# Patient Record
Sex: Male | Born: 1985 | Race: White | Hispanic: No | Marital: Single | Smoking: Current every day smoker
Health system: Southern US, Community
[De-identification: ages and names within clinical notes are randomized; demographics above are authoritative.]

---

## 2012-02-26 ENCOUNTER — Emergency Department (HOSPITAL_COMMUNITY): Payer: Self-pay

## 2012-02-26 ENCOUNTER — Encounter (HOSPITAL_COMMUNITY): Payer: Self-pay | Admitting: *Deleted

## 2012-02-26 ENCOUNTER — Emergency Department (HOSPITAL_COMMUNITY)
Admission: EM | Admit: 2012-02-26 | Discharge: 2012-02-26 | Disposition: A | Discharge status: Home / Self Care | Payer: Self-pay | Attending: Emergency Medicine | Admitting: Emergency Medicine

## 2012-02-26 DIAGNOSIS — S0180XA Unspecified open wound of other part of head, initial encounter: Secondary | ICD-10-CM | POA: Insufficient documentation

## 2012-02-26 DIAGNOSIS — S0085XA Superficial foreign body of other part of head, initial encounter: Secondary | ICD-10-CM | POA: Insufficient documentation

## 2012-02-26 DIAGNOSIS — F17200 Nicotine dependence, unspecified, uncomplicated: Secondary | ICD-10-CM | POA: Insufficient documentation

## 2012-02-26 DIAGNOSIS — IMO0002 Reserved for concepts with insufficient information to code with codable children: Secondary | ICD-10-CM | POA: Insufficient documentation

## 2012-02-26 DIAGNOSIS — S01112A Laceration without foreign body of left eyelid and periocular area, initial encounter: Secondary | ICD-10-CM

## 2012-02-26 DIAGNOSIS — F10929 Alcohol use, unspecified with intoxication, unspecified: Secondary | ICD-10-CM

## 2012-02-26 DIAGNOSIS — R4182 Altered mental status, unspecified: Secondary | ICD-10-CM | POA: Insufficient documentation

## 2012-02-26 DIAGNOSIS — T07XXXA Unspecified multiple injuries, initial encounter: Secondary | ICD-10-CM

## 2012-02-26 DIAGNOSIS — S0005XA Superficial foreign body of scalp, initial encounter: Secondary | ICD-10-CM | POA: Insufficient documentation

## 2012-02-26 DIAGNOSIS — R0602 Shortness of breath: Secondary | ICD-10-CM | POA: Insufficient documentation

## 2012-02-26 LAB — URINALYSIS, MICROSCOPIC ONLY
Ketones, ur: NEGATIVE mg/dL
Leukocytes, UA: NEGATIVE
Nitrite: NEGATIVE
Protein, ur: NEGATIVE mg/dL

## 2012-02-26 LAB — POCT I-STAT, CHEM 8
Creatinine, Ser: 1 mg/dL (ref 0.50–1.35)
Hemoglobin: 17.7 g/dL — ABNORMAL HIGH (ref 13.0–17.0)
Sodium: 143 mEq/L (ref 135–145)
TCO2: 23 mmol/L (ref 0–100)

## 2012-02-26 LAB — COMPREHENSIVE METABOLIC PANEL
ALT: 21 U/L (ref 0–53)
AST: 26 U/L (ref 0–37)
CO2: 24 mEq/L (ref 19–32)
Chloride: 104 mEq/L (ref 96–112)
Creatinine, Ser: 0.64 mg/dL (ref 0.50–1.35)
GFR calc non Af Amer: >90 mL/min (ref >90)
Total Bilirubin: 0.5 mg/dL (ref 0.3–1.2)

## 2012-02-26 LAB — CBC
MCH: 32.9 pg (ref 26.0–34.0)
MCHC: 35.5 g/dL (ref 30.0–36.0)
Platelets: 152 10*3/uL (ref 150–400)
RBC: 5.32 MIL/uL (ref 4.22–5.81)

## 2012-02-26 LAB — ETHANOL: Alcohol, Ethyl (B): 228 mg/dL — ABNORMAL HIGH (ref 0–11)

## 2012-02-26 LAB — SAMPLE TO BLOOD BANK

## 2012-02-26 LAB — PROTIME-INR: INR: 0.91 (ref 0.00–1.49)

## 2012-02-26 MED ORDER — HYDROCODONE-ACETAMINOPHEN 5-500 MG PO TABS: 1.0000 | ORAL_TABLET | Freq: Four times a day (QID) | ORAL | Status: AC | PRN

## 2012-02-26 MED ORDER — IOHEXOL 300 MG/ML  SOLN
100.0000 mL | Freq: Once | INTRAMUSCULAR | Status: AC | PRN
  Administered 2012-02-26: 100 mL via INTRAVENOUS
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
## 2012-02-26 NOTE — ED Notes (Addendum)
Pt. Wound care with normal saline and (2) 4 x 4. Pt. laceration bleeding and painful.
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
## 2012-02-26 NOTE — ED Notes (Signed)
LSB removed by this RN, Dr. Norlene Campbell, Juliette Mangle RN, [place] NT
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
## 2015-10-01 ENCOUNTER — Emergency Department (HOSPITAL_COMMUNITY): Payer: No Typology Code available for payment source

## 2015-10-01 ENCOUNTER — Encounter (HOSPITAL_COMMUNITY): Payer: Self-pay

## 2015-10-01 ENCOUNTER — Emergency Department (HOSPITAL_COMMUNITY)
Admission: EM | Admit: 2015-10-01 | Discharge: 2015-10-01 | Disposition: A | Discharge status: Home / Self Care | Payer: No Typology Code available for payment source | Attending: Emergency Medicine | Admitting: Emergency Medicine

## 2015-10-01 DIAGNOSIS — Y990 Civilian activity done for income or pay: Secondary | ICD-10-CM | POA: Diagnosis not present

## 2015-10-01 DIAGNOSIS — Y9301 Activity, walking, marching and hiking: Secondary | ICD-10-CM | POA: Diagnosis not present

## 2015-10-01 DIAGNOSIS — S80811A Abrasion, right lower leg, initial encounter: Secondary | ICD-10-CM | POA: Diagnosis not present

## 2015-10-01 DIAGNOSIS — Y92410 Unspecified street and highway as the place of occurrence of the external cause: Secondary | ICD-10-CM | POA: Insufficient documentation

## 2015-10-01 DIAGNOSIS — S8992XA Unspecified injury of left lower leg, initial encounter: Secondary | ICD-10-CM | POA: Diagnosis present

## 2015-10-01 DIAGNOSIS — M25562 Pain in left knee: Secondary | ICD-10-CM

## 2015-10-01 DIAGNOSIS — F17210 Nicotine dependence, cigarettes, uncomplicated: Secondary | ICD-10-CM | POA: Insufficient documentation

## 2015-10-01 MED ORDER — IBUPROFEN 400 MG PO TABS
400.0000 mg | ORAL_TABLET | Freq: Once | ORAL | Status: AC
  Administered 2015-10-01: 400 mg via ORAL
  Filled 2015-10-01: qty 1
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
## 2015-10-01 NOTE — Discharge Instructions (Signed)
There does not appear to be an emergent cause for your knee pain at this time. Your exam is reassuring. Your x-ray showed no broken bones or dislocations. You may use your knee sleeve as we discussed. Also use Motrin and Tylenol for discomfort. Follow up with orthopedic surgery for reevaluation if her symptoms do not improve. Return to ED for new or worsening symptoms as we discussed.  Joint Pain Joint pain, which is also called arthralgia, can be caused by many things. Joint pain often goes away when you follow your health care provider's instructions for relieving pain at home. However, joint pain can also be caused by conditions that require further treatment. Common causes of joint pain include:  Bruising in the area of the joint.  Overuse of the joint.  Wear and tear on the joints that occur with aging (osteoarthritis).  Various other forms of arthritis.  A buildup of a crystal form of uric acid in the joint (gout).  Infections of the joint (septic arthritis) or of the bone (osteomyelitis). Your health care provider may recommend medicine to help with the pain. If your joint pain continues, additional tests may be needed to diagnose your condition. HOME CARE INSTRUCTIONS Watch your condition for any changes. Follow these instructions as directed to lessen the pain that you are feeling.  Take medicines only as directed by your health care provider.  Rest the affected area for as long as your health care provider says that you should. If directed to do so, raise the painful joint above the level of your heart while you are sitting or lying down.  Do not do things that cause or worsen pain.  If directed, apply ice to the painful area:  Put ice in a plastic bag.  Place a towel between your skin and the bag.  Leave the ice on for 20 minutes, 2-3 times per day.  Wear an elastic bandage, splint, or sling as directed by your health care provider. Loosen the elastic bandage or splint if  your fingers or toes become numb and tingle, or if they turn cold and blue.  Begin exercising or stretching the affected area as directed by your health care provider. Ask your health care provider what types of exercise are safe for you.  Keep all follow-up visits as directed by your health care provider. This is important. SEEK MEDICAL CARE IF:  Your pain increases, and medicine does not help.  Your joint pain does not improve within 3 days.  You have increased bruising or swelling.  You have a fever.  You lose 10 lb (4.5 kg) or more without trying. SEEK IMMEDIATE MEDICAL CARE IF:  You are not able to move the joint.  Your fingers or toes become numb or they turn cold and blue.   This information is not intended to replace advice given to you by your health care provider. Make sure you discuss any questions you have with your health care provider.   Document Released: 06/25/2005 Document Revised: 07/16/2014 Document Reviewed: 04/06/2014 Elsevier Interactive Patient Education [employer]2016 Elsevier Inc.

## 2015-10-01 NOTE — ED Notes (Signed)
Declined W/C at D/C and was escorted to lobby by RN. 
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
## 2015-10-05 DIAGNOSIS — S8992XA Unspecified injury of left lower leg, initial encounter: Secondary | ICD-10-CM | POA: Insufficient documentation

## 2018-08-14 ENCOUNTER — Encounter: Payer: Self-pay | Admitting: Physician Assistant

## 2018-08-14 ENCOUNTER — Other Ambulatory Visit: Payer: Self-pay

## 2018-08-14 ENCOUNTER — Ambulatory Visit: Payer: Self-pay | Admitting: Physician Assistant

## 2018-08-14 VITALS — BP 113/73 | HR 62 | Temp 98.0°F | Resp 16 | Ht 72.0 in | Wt 163.0 lb

## 2018-08-14 DIAGNOSIS — R0981 Nasal congestion: Secondary | ICD-10-CM

## 2018-08-14 DIAGNOSIS — K0510 Chronic gingivitis, plaque induced: Secondary | ICD-10-CM

## 2018-08-14 MED ORDER — FLUTICASONE PROPIONATE 50 MCG/ACT NA SUSP: 2.0000 | Freq: Every day | NASAL | 12 refills | Status: AC

## 2018-08-14 MED ORDER — CHLORHEXIDINE GLUCONATE 0.12 % MT SOLN: 15.0000 mL | Freq: Two times a day (BID) | OROMUCOSAL | 0 refills | Status: AC

## 2018-08-14 NOTE — Progress Notes (Signed)
   Nicolas Wilkerson  MRN: [id number] DOB: 08/29/1985  PCP: No primary care provider on file.  Subjective:  Pt is a 33 year old male who presents to clinic for nasal congestion x 3 days. Endorses some facial swelling on his left side near the back of his jaw with an occasional "tingling" sensation. Denies pain with chewing, fever, chills, bad taste in mouth, sore throat.   Review of Systems  Constitutional: Negative for chills and fever.  HENT: Positive for congestion, facial swelling (mild, left) and postnasal drip. Negative for dental problem, mouth sores, sinus pressure, sinus pain, sore throat and trouble swallowing.   Respiratory: Negative for cough.     Patient Active Problem List   Diagnosis Date Noted  . Injury of left lower leg 10/05/2015    No current outpatient medications on file prior to visit.   No current facility-administered medications on file prior to visit.     No Known Allergies   Objective:  BP 113/73   Pulse 62   Temp 98 F (36.7 C) (Oral)   Resp 16   Ht 6' (1.829 m)   Wt 163 lb (73.9 kg)   SpO2 98%   BMI 22.11 kg/m   Physical Exam Vitals signs reviewed.  Constitutional:      General: He is not in acute distress. HENT:     Right Ear: Tympanic membrane normal.     Left Ear: Tympanic membrane normal.     Nose: Mucosal edema present. No rhinorrhea.     Right Sinus: No maxillary sinus tenderness or frontal sinus tenderness.     Left Sinus: No maxillary sinus tenderness or frontal sinus tenderness.     Mouth/Throat:     Mouth: Mucous membranes are moist.     Dentition: No gingival swelling, dental caries, dental abscesses or gum lesions.     Pharynx: Oropharynx is clear. No pharyngeal swelling, oropharyngeal exudate or posterior oropharyngeal erythema.     Comments: Missing back upper molars Gums are not TTP. Salivary glands not TTP, no swelling  Cardiovascular:     Rate and Rhythm: Normal rate and regular rhythm.     Heart sounds: Normal  heart sounds.  Pulmonary:     Effort: Pulmonary effort is normal. No respiratory distress.     Breath sounds: Normal breath sounds. No wheezing or rales.  Skin:    General: Skin is warm and dry.  Neurological:     Mental Status: He is alert and oriented to person, place, and time.  Psychiatric:        Judgment: Judgment normal.     Assessment and Plan :  1. Gingivitis _ Pt endorses mild swelling and tingling of left upper gums. Plan to treat topically. RTC if no improvement.  - chlorhexidine (PERIDEX) 0.12 % solution; Use as directed 15 mLs in the mouth or throat 2 (two) times daily.  Dispense: 120 mL; Refill: 0  2. Nasal congestion - fluticasone (FLONASE) 50 MCG/ACT nasal spray; Place 2 sprays into both nostrils daily.  Dispense: 16 g; Refill: 12    Whitney Thadius Smisek, PA-C  [facility] at [facility] 08/14/2018 2:44 PM  Please note: Portions of this report may have been transcribed using dragon voice recognition software. Every effort was made to ensure accuracy; however, inadvertent computerized transcription errors may be present.

## 2018-08-14 NOTE — Patient Instructions (Addendum)
Chlorhexidine: Swish for 30 seconds with 15 mL (one capful) of undiluted oral rinse after toothbrushing, then expectorate; repeat twice daily (morning and evening).   Use flonase nasal spray at night before bed. 2 sprays each nostril.   Stay well hydrated. Drink at least 64 oz water/daily.    Come back if you fail to improve or if your symptoms worsen.    Health Risks of Smoking Smoking cigarettes is very bad for your health. Tobacco smoke has over 200 known poisons in it. It contains the poisonous gases nitrogen oxide and carbon monoxide. There are over 60 chemicals in tobacco smoke that cause cancer. Smoking is difficult to quit because a chemical in tobacco, called nicotine, causes addiction or dependence. When you smoke and inhale, nicotine is absorbed rapidly into the bloodstream through your lungs. Both inhaled and non-inhaled nicotine may be addictive. What are the risks of cigarette smoke? Cigarette smokers have an increased risk of many serious medical problems, including:  Lung cancer.  Lung disease, such as pneumonia, bronchitis, and emphysema.  Chest pain (angina) and heart attack because the heart is not getting enough oxygen.  Heart disease and peripheral blood vessel disease.  High blood pressure (hypertension).  Stroke.  Oral cancer, including cancer of the lip, mouth, or voice box.  Bladder cancer.  Pancreatic cancer.  Cervical cancer.  Pregnancy complications, including premature birth.  Stillbirths and smaller newborn babies, birth defects, and genetic damage to sperm.  Early menopause.  Lower estrogen level for women.  Infertility.  Facial wrinkles.  Blindness.  Increased risk of broken bones (fractures).  Senile dementia.  Stomach ulcers and internal bleeding.  Delayed wound healing and increased risk of complications during surgery.  Even smoking lightly shortens your life expectancy by several years. Because of secondhand smoke  exposure, children of smokers have an increased risk of the following:  Sudden infant death syndrome (SIDS).  Respiratory infections.  Lung cancer.  Heart disease.  Ear infections. What are the benefits of quitting? There are many health benefits of quitting smoking. Here are some of them:  Within days of quitting smoking, your risk of having a heart attack decreases, your blood flow improves, and your lung capacity improves. Blood pressure, pulse rate, and breathing patterns start returning to normal soon after quitting.  Within months, your lungs may clear up completely.  Quitting for 10 years reduces your risk of developing lung cancer and heart disease to almost that of a nonsmoker.  People who quit may see an improvement in their overall quality of life. How do I quit smoking?     Smoking is an addiction with both physical and psychological effects, and longtime habits can be hard to change. Your health care provider can recommend:  Programs and community resources, which may include group support, education, or talk therapy.  Prescription medicines to help reduce cravings.  Nicotine replacement products, such as patches, gum, and nasal sprays. Use these products only as directed. Do not replace cigarette smoking with electronic cigarettes, which are commonly called e-cigarettes. The safety of e-cigarettes is not known, and some may contain harmful chemicals.  A combination of two or more of these methods. Where to find more information  American Lung Association: www.lung.org  American Cancer Society: www.cancer.org Summary  Smoking cigarettes is very bad for your health. Cigarette smokers have an increased risk of many serious medical problems, including several cancers, heart disease, and stroke.  Smoking is an addiction with both physical and psychological effects, and  longtime habits can be hard to change.  By stopping right away, you can greatly reduce the risk  of medical problems for you and your family.  To help you quit smoking, your health care provider can recommend programs, community resources, prescription medicines, and nicotine replacement products such as patches, gum, and nasal sprays. This information is not intended to replace advice given to you by your health care provider. Make sure you discuss any questions you have with your health care provider. Document Released: 08/02/2004 Document Revised: 09/26/2017 Document Reviewed: 06/29/2016 Elsevier Interactive Patient Education  2019 [employer].
